# Patient Record
Sex: Male | Born: 1985 | Race: White | Hispanic: No | Marital: Married | Smoking: Former smoker
Health system: Southern US, Community
[De-identification: ages and names within clinical notes are randomized; demographics above are authoritative.]

## PROBLEM LIST (undated history)

## (undated) ENCOUNTER — Ambulatory Visit: Disposition: A | Discharge status: Home / Self Care | Payer: No Typology Code available for payment source

## (undated) DIAGNOSIS — E291 Testicular hypofunction: Secondary | ICD-10-CM

## (undated) DIAGNOSIS — F419 Anxiety disorder, unspecified: Secondary | ICD-10-CM

## (undated) DIAGNOSIS — E785 Hyperlipidemia, unspecified: Secondary | ICD-10-CM

## (undated) DIAGNOSIS — M25519 Pain in unspecified shoulder: Secondary | ICD-10-CM

## (undated) HISTORY — DX: Testicular hypofunction: E29.1

## (undated) HISTORY — DX: Hyperlipidemia, unspecified: E78.5

---

## 2007-02-23 HISTORY — PX: WISDOM TOOTH EXTRACTION: SHX21

## 2011-04-15 ENCOUNTER — Ambulatory Visit: Payer: Self-pay

## 2011-06-19 ENCOUNTER — Ambulatory Visit: Payer: Self-pay | Admitting: Internal Medicine

## 2014-05-12 ENCOUNTER — Ambulatory Visit: Payer: Self-pay | Admitting: Emergency Medicine

## 2015-08-12 ENCOUNTER — Other Ambulatory Visit: Payer: Self-pay | Admitting: Orthopedic Surgery

## 2015-08-12 DIAGNOSIS — M25512 Pain in left shoulder: Secondary | ICD-10-CM

## 2015-08-12 DIAGNOSIS — M75102 Unspecified rotator cuff tear or rupture of left shoulder, not specified as traumatic: Secondary | ICD-10-CM

## 2015-08-20 ENCOUNTER — Other Ambulatory Visit: Payer: Self-pay

## 2015-09-08 ENCOUNTER — Other Ambulatory Visit: Payer: Self-pay | Admitting: Orthopedic Surgery

## 2015-09-08 DIAGNOSIS — M75102 Unspecified rotator cuff tear or rupture of left shoulder, not specified as traumatic: Secondary | ICD-10-CM

## 2015-09-08 DIAGNOSIS — M25512 Pain in left shoulder: Secondary | ICD-10-CM

## 2015-09-24 ENCOUNTER — Ambulatory Visit
Admission: RE | Admit: 2015-09-24 | Discharge: 2015-09-24 | Disposition: A | Discharge status: Home / Self Care | Payer: No Typology Code available for payment source | Source: Ambulatory Visit | Attending: Orthopedic Surgery | Admitting: Orthopedic Surgery

## 2015-09-24 DIAGNOSIS — M25512 Pain in left shoulder: Secondary | ICD-10-CM | POA: Diagnosis not present

## 2015-09-24 DIAGNOSIS — M75102 Unspecified rotator cuff tear or rupture of left shoulder, not specified as traumatic: Secondary | ICD-10-CM | POA: Diagnosis not present

## 2015-09-24 MED ORDER — IOHEXOL 180 MG/ML  SOLN
20.0000 mL | Freq: Once | INTRAMUSCULAR | Status: AC | PRN
  Administered 2015-09-24: 10 mL via INTRA_ARTICULAR

## 2015-09-24 MED ORDER — SODIUM CHLORIDE 0.9 % IJ SOLN
10.0000 mL | INTRAMUSCULAR | Status: DC | PRN
  Administered 2015-09-24: 10 mL
  Filled 2015-09-24: qty 10

## 2015-09-24 MED ORDER — GADOBENATE DIMEGLUMINE 529 MG/ML IV SOLN
5.0000 mL | Freq: Once | INTRAVENOUS | Status: AC | PRN
  Administered 2015-09-24: 1 mL via INTRA_ARTICULAR

## 2016-01-03 ENCOUNTER — Ambulatory Visit (INDEPENDENT_AMBULATORY_CARE_PROVIDER_SITE_OTHER): Payer: No Typology Code available for payment source

## 2016-01-03 ENCOUNTER — Encounter: Payer: Self-pay | Admitting: Gynecology

## 2016-01-03 ENCOUNTER — Ambulatory Visit
Admission: EM | Admit: 2016-01-03 | Discharge: 2016-01-03 | Disposition: A | Discharge status: Home / Self Care | Payer: No Typology Code available for payment source | Attending: Emergency Medicine | Admitting: Emergency Medicine

## 2016-01-03 DIAGNOSIS — M545 Low back pain, unspecified: Secondary | ICD-10-CM

## 2016-01-03 HISTORY — DX: Pain in unspecified shoulder: M25.519

## 2016-01-03 HISTORY — DX: Anxiety disorder, unspecified: F41.9

## 2016-01-03 MED ORDER — PREDNISONE 10 MG (21) PO TBPK: ORAL_TABLET | ORAL | 0 refills | Status: DC

## 2016-01-03 MED ORDER — TIZANIDINE HCL 4 MG PO TABS: 4.0000 mg | ORAL_TABLET | Freq: Three times a day (TID) | ORAL | 0 refills | Status: DC | PRN

## 2016-01-03 MED ORDER — HYDROCODONE-ACETAMINOPHEN 5-325 MG PO TABS: 1.0000 | ORAL_TABLET | Freq: Four times a day (QID) | ORAL | 0 refills | Status: DC | PRN

## 2016-01-03 NOTE — ED Triage Notes (Signed)
Patient c/o lower right back pain x over three days.

## 2016-01-03 NOTE — ED Provider Notes (Signed)
HPI  SUBJECTIVE:  Johnathan King is a 30 y.o. male who presents with 3-4 days of right, sharp, intermittent, minutes long low back pain. He states that the pain is present depending on his activity.  it is nonmigratory. He states it radiated down the anterior central part of his leg at the first day, but this has now resolved. He denies any trauma, recent heavy lifting or change in his physical routine. He works out frequently. States that he has not worked out his back recently. He tried 500 mg of Naprosyn one hour PTA, which did not help. Symptoms are better with sitting still, worse with walking, going from sitting to standing and bending forward. denies N/V, fevers, flank pain, abdominal pain, urinary urgency, frequency, dysuria, cloudy or odorous urine, hematuria.  No syncope. No saddle anesthesia, distal weakness/numbness, bilateral radicular leg pain/weakness, fevers/night sweats, recent h/o trauma,  bladder/ bowel incontinence, urinary retention, h/o CA / multiple myleoma, unexplained weight loss, pain worse at night,  h/o prolonged steroid use, h/o osteopenia, h/o IVDU, h/o HIV, known AAA.  States feels similar to but is worse compared to previous episodes of back pain. no h/o pyelonephritis, nephrolithiasis. [facility]  Past Medical History:  Diagnosis Date  . Anxiety   . Shoulder pain     History reviewed. No pertinent surgical history.  No family history on file.  Social History  Substance Use Topics  . Smoking status: Never Smoker  . Smokeless tobacco: Never Used  . Alcohol use Yes    No current facility-administered medications for this encounter.   Current Outpatient Prescriptions:  .  escitalopram (LEXAPRO) 10 MG tablet, Take 10 mg by mouth daily., Disp: , Rfl:  .  Multiple Vitamin (MULTIVITAMIN) tablet, Take 1 tablet by mouth daily., Disp: , Rfl:  .  naproxen (NAPROSYN) 500 MG tablet, Take 500 mg by mouth 2 (two) times daily with a meal., Disp: ,  Rfl:  .  TESTOSTERONE IM, Inject into the muscle., Disp: , Rfl:  .  HYDROcodone-acetaminophen (NORCO/VICODIN) 5-325 MG tablet, Take 1-2 tablets by mouth every 6 (six) hours as needed for moderate pain., Disp: 24 tablet, Rfl: 0 .  predniSONE (STERAPRED UNI-PAK 21 TAB) 10 MG (21) TBPK tablet, Dispense one 6 day pack. Take as directed with food., Disp: 21 tablet, Rfl: 0 .  tiZANidine (ZANAFLEX) 4 MG tablet, Take 1 tablet (4 mg total) by mouth every 8 (eight) hours as needed for muscle spasms., Disp: 30 tablet, Rfl: 0  Not on File   ROS  As noted in HPI.   Physical Exam  BP 135/85 (BP Location: Left Arm)   Pulse 76   Temp 98.8 F (37.1 C) (Oral)   Resp 18   Ht 5\' 9"  (1.753 m)   Wt 205 lb (93 kg)   SpO2 99%   BMI 30.27 kg/m   Constitutional: Well developed, well nourished, no acute distress Eyes:  EOMI, conjunctiva normal bilaterally HENT: Normocephalic, atraumatic,mucus membranes moist Respiratory: Normal inspiratory effort Cardiovascular: Normal rate GI: nondistended. No suprapubic tenderness skin: No rash, skin intact Musculoskeletal: no CVAT. + R paralumbar tenderness, + muscle spasm. No bony tenderness. Bilateral lower extremities nontender. Pain aggravated with active knee extension, right hip extension, passive hip flexion bilaterally. No pain with int/ext rotation extension hips bilaterally. SLR neg bilaterally. Sensation baseline light touch bilaterally for Pt, DTR's symmetric and intact bilaterally KJ, Motor symmetric bilateral 5/5 hip flexion, quadriceps, hamstrings, EHL, foot dorsiflexion, foot plantarflexion, gait normal.  Neurologic:  Alert & oriented x 3, no focal neuro deficits Psychiatric: Speech and behavior appropriate   ED Course   Medications - No data to display  Orders Placed This Encounter  Procedures  . DG Lumbar Spine Complete    Standing Status:   Standing    Number of Occurrences:   1    Order Specific Question:   Reason for Exam (SYMPTOM  OR  DIAGNOSIS REQUIRED)    Answer:   R LBP r/o fx, dislcoation, DJD  . Ambulatory referral to Physical Therapy    Referral Priority:   Routine    Referral Type:   Physical Medicine    Referral Reason:   Specialty Services Required    Requested Specialty:   Physical Therapy    Number of Visits Requested:   1    No results found for this or any previous visit (from the past 24 hour(s)). Dg Lumbar Spine Complete  Result Date: 01/03/2016 CLINICAL DATA:  Right acute low back pain for 1 week EXAM: LUMBAR SPINE - COMPLETE 4+ VIEW COMPARISON:  None. FINDINGS: There is no evidence of lumbar spine fracture. Alignment is normal. Intervertebral disc spaces are maintained. IMPRESSION: Negative. Electronically Signed   By: Judie PetitM.  Shick M.D.   On: 01/03/2016 12:12    ED Clinical Impression  Acute right-sided low back pain without sciatica   ED Assessment/Plan  [place] narcotic database reviewed. Pt with no opiate prescriptions in the past 6 months.  No historical evidence of uti, nephrolithiasis.  No historical red flags as noted in HPI. No physical red flags such as fever, bony tenderness, lower extremity weakness, saddle anesthesia. We'll get imaging  to rule out any acute changes..   Imaging independently reviewed.  negative for any acute changes. Normal alignment, normal intervertebral disc spaces. See radiology report for details   Pt ambulatory in the ED. Home with regular Naprosyn, patient states that he does not need a prescription for this, tramadol, muscle relaxants, steriod. Pt to f/u with PT and PMD.  Discussed imaging, medical decision-making, and plan for follow-up with the patient.  Discussed signs and symptoms that should prompt return to the emergency department.  Patient agrees with plan.   *This clinic note was created using Dragon dictation software. Therefore, there may be occasional mistakes despite careful proofreading.  ?    Domenick GongAshley Karson Chicas, MD 01/05/16 [phone]

## 2017-10-09 ENCOUNTER — Encounter: Payer: Self-pay | Admitting: Gynecology

## 2017-10-09 ENCOUNTER — Ambulatory Visit
Admission: EM | Admit: 2017-10-09 | Discharge: 2017-10-09 | Disposition: A | Discharge status: Home / Self Care | Payer: No Typology Code available for payment source | Attending: Internal Medicine | Admitting: Internal Medicine

## 2017-10-09 ENCOUNTER — Other Ambulatory Visit: Payer: Self-pay

## 2017-10-09 DIAGNOSIS — X509XXA Other and unspecified overexertion or strenuous movements or postures, initial encounter: Secondary | ICD-10-CM

## 2017-10-09 DIAGNOSIS — M7522 Bicipital tendinitis, left shoulder: Secondary | ICD-10-CM | POA: Diagnosis not present

## 2017-10-09 MED ORDER — NAPROXEN 500 MG PO TABS: 500.0000 mg | ORAL_TABLET | Freq: Two times a day (BID) | ORAL | 0 refills | Status: DC

## 2017-10-09 NOTE — ED Provider Notes (Signed)
[facility]    CSN: [id number] Arrival date & time: 10/09/17  1030     History   Chief Complaint Chief Complaint  Patient presents with  . Shoulder Pain    HPI Johnathan King is a 32 y.o. male.   HPI  32 year old male presents with left shoulder pain that has had for 5 days.  It is mostly perceived at the insertion of the deltoid and over the coracoid area.  He was bench pressing 290 pounds and he allowed the weights to slightly deeper dip in approaching closer to his chest.  At that point he felt pain over the inferior medial deltoid.  It is been increasing since that time.  Hurts mostly with overhead motion or when throwing as in a ball.  He has not returned to lifting weights since the injury.         Past Medical History:  Diagnosis Date  . Anxiety   . Shoulder pain     There are no active problems to display for this patient.   History reviewed. No pertinent surgical history.     Home Medications    Prior to Admission medications   Medication Sig Start Date End Date Taking? Authorizing Provider  escitalopram (LEXAPRO) 10 MG tablet Take 10 mg by mouth daily.   Yes [provider]  Multiple Vitamin (MULTIVITAMIN) tablet Take 1 tablet by mouth daily.   Yes [provider]  TESTOSTERONE IM Inject into the muscle.   Yes [provider]  naproxen (NAPROSYN) 500 MG tablet Take 1 tablet (500 mg total) by mouth 2 (two) times daily with a meal. 10/09/17   Lutricia Feiloemer, William P, PA-C  tiZANidine (ZANAFLEX) 4 MG tablet Take 1 tablet (4 mg total) by mouth every 8 (eight) hours as needed for muscle spasms. 01/03/16   Domenick GongMortenson, Ashley, MD    Family History Family History  Problem Relation Age of Onset  . Hypertension Father     Social History Social History   Tobacco Use  . Smoking status: Never Smoker  . Smokeless tobacco: Never Used  Substance Use Topics  . Alcohol use: Yes  . Drug use: Not on file      Allergies   Patient has no known allergies.   Review of Systems Review of Systems  Constitutional: Positive for activity change. Negative for appetite change, chills, fatigue and fever.  Musculoskeletal: Positive for myalgias.  All other systems reviewed and are negative.    Physical Exam Triage Vital Signs ED Triage Vitals  Enc Vitals Group     BP 10/09/17 1036 (!) 134/93     Pulse Rate 10/09/17 1036 91     Resp --      Temp 10/09/17 1036 98.4 F (36.9 C)     Temp Source 10/09/17 1036 Oral     SpO2 10/09/17 1036 98 %     Weight 10/09/17 1037 210 lb (95.3 kg)     Height 10/09/17 1037 5\' 9"  (1.753 m)     Head Circumference --      Peak Flow --      Pain Score 10/09/17 1037 6     Pain Loc --      Pain Edu? --      Excl. in GC? --    No data found.  Updated Vital Signs BP (!) 134/93 (BP Location: Left Arm)   Pulse 91   Temp 98.4 F (36.9 C) (Oral)   Ht 5\' 9"  (1.753 m)  Wt 210 lb (95.3 kg)   SpO2 98%   BMI 31.01 kg/m   Visual Acuity Right Eye Distance:   Left Eye Distance:   Bilateral Distance:    Right Eye Near:   Left Eye Near:    Bilateral Near:     Physical Exam  Constitutional: He is oriented to person, place, and time. He appears well-developed and well-nourished. No distress.  HENT:  Head: Normocephalic.  Eyes: Pupils are equal, round, and reactive to light. Right eye exhibits no discharge. Left eye exhibits no discharge.  Neck: Normal range of motion.  Musculoskeletal: Normal range of motion. He exhibits tenderness.  Exam of the left shoulder shows nests of the medial aspect of the deltoid body and also maximal over the coracoid process.  Flexion does not seem to bother him but is more noticeable with internal rotation than external rotation.  He is a negative empty can test.  Is a negative arm drop test.  Neurological: He is alert and oriented to person, place, and time.  Skin: Skin is warm and dry. He is not diaphoretic.  Psychiatric: He  has a normal mood and affect. His behavior is normal. Judgment and thought content normal.  Nursing note and vitals reviewed.    UC Treatments / Results  Labs (all labs ordered are listed, but only abnormal results are displayed) Labs Reviewed - No data to display  EKG None  Radiology No results found.  Procedures Procedures (including critical care time)  Medications Ordered in UC Medications - No data to display  Initial Impression / Assessment and Plan / UC Course  I have reviewed the triage vital signs and the nursing notes.  Pertinent labs & imaging results that were available during my care of the patient were reviewed by me and considered in my medical decision making (see chart for details).     Plan: 1. Test/x-ray results and diagnosis reviewed with patient 2. rx as per orders; risks, benefits, potential side effects reviewed with patient 3. Recommend supportive treatment with rest and symptom avoidance.  Ice 20 minutes every 2 hours 4-5 times daily.  Should rest his shoulder for the next 2 to 4 weeks.  When he does return to activities he was cautioned to return slowly and increase gradually to his present abilities.  Prescribed Naprosyn 500 mg twice daily with meals.  If he is not improving he should follow-up with orthopedic surgery for possible MRI 4. F/u prn if symptoms worsen or don't improve  Final Clinical Impressions(s) / UC Diagnoses   Final diagnoses:  Tendonitis of upper biceps tendon of left shoulder   Discharge Instructions   None    ED Prescriptions    Medication Sig Dispense Auth. Provider   naproxen (NAPROSYN) 500 MG tablet Take 1 tablet (500 mg total) by mouth 2 (two) times daily with a meal. 60 tablet Lutricia Feiloemer, William P, PA-C     Controlled Substance Prescriptions [place] Controlled Substance Registry consulted? Not Applicable   Lutricia FeilRoemer, William P, PA-C 10/09/17 1124

## 2017-10-09 NOTE — ED Triage Notes (Signed)
Patient c/o left shoulder pain x 5 days.> per patient was lifting at the gym.

## 2018-01-26 ENCOUNTER — Emergency Department: Payer: No Typology Code available for payment source

## 2018-01-26 ENCOUNTER — Encounter: Payer: Self-pay | Admitting: Emergency Medicine

## 2018-01-26 ENCOUNTER — Emergency Department
Admission: EM | Admit: 2018-01-26 | Discharge: 2018-01-26 | Disposition: A | Discharge status: Home / Self Care | Payer: No Typology Code available for payment source | Attending: Emergency Medicine | Admitting: Emergency Medicine

## 2018-01-26 DIAGNOSIS — Y998 Other external cause status: Secondary | ICD-10-CM | POA: Insufficient documentation

## 2018-01-26 DIAGNOSIS — S4992XA Unspecified injury of left shoulder and upper arm, initial encounter: Secondary | ICD-10-CM | POA: Diagnosis present

## 2018-01-26 DIAGNOSIS — S46812A Strain of other muscles, fascia and tendons at shoulder and upper arm level, left arm, initial encounter: Secondary | ICD-10-CM | POA: Diagnosis not present

## 2018-01-26 DIAGNOSIS — Z79899 Other long term (current) drug therapy: Secondary | ICD-10-CM | POA: Diagnosis not present

## 2018-01-26 DIAGNOSIS — T1490XA Injury, unspecified, initial encounter: Secondary | ICD-10-CM

## 2018-01-26 DIAGNOSIS — T148XXA Other injury of unspecified body region, initial encounter: Secondary | ICD-10-CM

## 2018-01-26 DIAGNOSIS — Y93B3 Activity, free weights: Secondary | ICD-10-CM | POA: Diagnosis not present

## 2018-01-26 DIAGNOSIS — Y92838 Other recreation area as the place of occurrence of the external cause: Secondary | ICD-10-CM | POA: Diagnosis not present

## 2018-01-26 DIAGNOSIS — X509XXA Other and unspecified overexertion or strenuous movements or postures, initial encounter: Secondary | ICD-10-CM | POA: Insufficient documentation

## 2018-01-26 LAB — BASIC METABOLIC PANEL
Anion gap: 8 (ref 5–15)
BUN: 25 mg/dL — AB (ref 6–20)
CHLORIDE: 103 mmol/L (ref 98–111)
CO2: 27 mmol/L (ref 22–32)
Calcium: 9.2 mg/dL (ref 8.9–10.3)
Creatinine, Ser: 0.93 mg/dL (ref 0.61–1.24)
GFR calc Af Amer: >60 mL/min (ref >60)
GFR calc non Af Amer: >60 mL/min (ref >60)
GLUCOSE: 88 mg/dL (ref 70–99)
Potassium: 4 mmol/L (ref 3.5–5.1)
Sodium: 138 mmol/L (ref 135–145)

## 2018-01-26 NOTE — ED Triage Notes (Signed)
Pt reports got off work this am and went to the gym and was lifting weights and he felt a pop in his left shoulder and now can't lift it above half way.

## 2018-01-26 NOTE — ED Provider Notes (Signed)
[facility] Emergency Department Provider Note  ____________________________________________  Time seen: Approximately 1:05 PM  I have reviewed the triage vital signs and the nursing notes.   HISTORY  Chief Complaint Shoulder Injury    HPI Johnathan King is a 32 y.o. male that presents to the emergency department for evaluation of left anterior shoulder pain after lifting weights at the gym.  Patient states that when he was lifting weights, he felt a pop in his left shoulder.  He has had pain over the front of his shoulder since.  He has pain after he lifts his arm about halfway.  Pain does not radiate.  No additional injuries.  Patient is a Emergency planning/management officer.   Past Medical History:  Diagnosis Date  . Anxiety   . Shoulder pain     There are no active problems to display for this patient.   History reviewed. No pertinent surgical history.  Prior to Admission medications   Medication Sig Start Date End Date Taking? Authorizing Provider  escitalopram (LEXAPRO) 10 MG tablet Take 10 mg by mouth daily.    [provider]  Multiple Vitamin (MULTIVITAMIN) tablet Take 1 tablet by mouth daily.    [provider]  naproxen (NAPROSYN) 500 MG tablet Take 1 tablet (500 mg total) by mouth 2 (two) times daily with a meal. 10/09/17   Lutricia Feil, PA-C  TESTOSTERONE IM Inject into the muscle.    [provider]  tiZANidine (ZANAFLEX) 4 MG tablet Take 1 tablet (4 mg total) by mouth every 8 (eight) hours as needed for muscle spasms. 01/03/16   Domenick Gong, MD    Allergies Patient has no known allergies.  Family History  Problem Relation Age of Onset  . Hypertension Father     Social History Social History   Tobacco Use  . Smoking status: Never Smoker  . Smokeless tobacco: Never Used  Substance Use Topics  . Alcohol use: Yes  . Drug use: Not on file     Review of Systems  Cardiovascular: No chest  pain. Respiratory: No cough. No SOB. Gastrointestinal: No abdominal pain.  No nausea, no vomiting.  Musculoskeletal: Positive for shoulder pain.  Skin: Negative for rash, abrasions, lacerations, ecchymosis. Neurological: Negative for numbness or tingling   ____________________________________________   PHYSICAL EXAM:  VITAL SIGNS: ED Triage Vitals  Enc Vitals Group     BP 01/26/18 1140 (!) 141/92     Pulse Rate 01/26/18 1140 87     Resp --      Temp 01/26/18 1140 97.9 F (36.6 C)     Temp Source 01/26/18 1140 Oral     SpO2 01/26/18 1140 99 %     Weight 01/26/18 1131 205 lb (93 kg)     Height 01/26/18 1131 5\' 9"  (1.753 m)     Head Circumference --      Peak Flow --      Pain Score 01/26/18 1131 8     Pain Loc --      Pain Edu? --      Excl. in GC? --      Constitutional: Alert and oriented. Well appearing and in no acute distress. Eyes: Conjunctivae are normal. PERRL. EOMI. Head: Atraumatic. ENT:      Ears:      Nose: No congestion/rhinnorhea.      Mouth/Throat: Mucous membranes are moist.  Neck: No stridor.  No cervical spine tenderness to palpation. Cardiovascular: Normal rate, regular rhythm.  Good peripheral  circulation.  Symmetric radial pulses bilaterally. Respiratory: Normal respiratory effort without tachypnea or retractions. Lungs CTAB. Good air entry to the bases with no decreased or absent breath sounds. Gastrointestinal: Bowel sounds 4 quadrants. Soft and nontender to palpation. No guarding or rigidity. No palpable masses. No distention. Musculoskeletal: Full range of motion to all extremities. No gross deformities appreciated.  Full range of motion of left shoulder.  Tenderness to palpation over anterior shoulder.  Pain with 90 degree abduction of left shoulder.  No difficulty with resisted flexion and extension of elbow. Neurologic:  Normal speech and language. No gross focal neurologic deficits are appreciated.  Skin:  Skin is warm, dry and intact. No  rash noted. Psychiatric: Mood and affect are normal. Speech and behavior are normal. Patient exhibits appropriate insight and judgement.   ____________________________________________   LABS (all labs ordered are listed, but only abnormal results are displayed)  Labs Reviewed  BASIC METABOLIC PANEL - Abnormal; Notable for the following components:      Result Value   BUN 25 (*)    All other components within normal limits   ____________________________________________  EKG   ____________________________________________  RADIOLOGY Lexine BatonI, Kelwin Gibler, personally viewed and evaluated these images (plain radiographs) as part of my medical decision making, as well as reviewing the written report by the radiologist.  Mr Shoulder Left Wo Contrast  Result Date: 01/26/2018 CLINICAL DATA:  The patient felt a pop in the left shoulder lifting weights today with onset of pain and limited range of motion. EXAM: MRI OF THE LEFT SHOULDER WITHOUT CONTRAST TECHNIQUE: Multiplanar, multisequence MR imaging of the shoulder was performed. No intravenous contrast was administered. COMPARISON:  Plain films left shoulder 01/26/2018. MR arthrogram left shoulder 09/24/2015. FINDINGS: Rotator cuff:  Intact and normal in appearance. Muscles: There is partial imaging of edema in the anterior, medial aspect of the deltoid consistent with strain and likely tear at the musculotendinous junction. This finding is incompletely imaged. Biceps long head:  Intact and normal in appearance. Acromioclavicular Joint: Normal. Type 2 acromion. No subacromial/subdeltoid bursal fluid. Glenohumeral Joint: Negative. Labrum:  Intact. Bones:  Normal marrow signal throughout. Other: Negative. IMPRESSION: The examination is positive for strain of the anterior, medial aspect of the deltoid at the musculotendinous junction. There is likely a tear at the musculotendinous junction. The finding is incompletely imaged. Intact rotator cuff, long head  of biceps and glenoid labrum. Electronically Signed   By: Drusilla Kannerhomas  Dalessio M.D.   On: 01/26/2018 14:49   Dg Shoulder Left  Result Date: 01/26/2018 CLINICAL DATA:  32 year old male with left shoulder pain and decreased range of motion after felt a pop while lifting weights. EXAM: LEFT SHOULDER - 2+ VIEW COMPARISON:  Left shoulder arthrogram A2 17. FINDINGS: Bone mineralization is within normal limits. There is no evidence of fracture or dislocation. There is no evidence of arthropathy or other focal bone abnormality. Negative visible left ribs and lung parenchyma. IMPRESSION: Negative. Electronically Signed   By: Odessa FlemingH  Hall M.D.   On: 01/26/2018 12:27    ____________________________________________    PROCEDURES  Procedure(s) performed:    Procedures    Medications - No data to display   ____________________________________________   INITIAL IMPRESSION / ASSESSMENT AND PLAN / ED COURSE  Pertinent labs & imaging results that were available during my care of the patient were reviewed by me and considered in my medical decision making (see chart for details).  Review of the [place] CSRS was performed in accordance of the [facility] prior  to dispensing any controlled drugs.   Patient presented to the emergency department for evaluation of shoulder injury.  Exam is consistent with muscle strain or tear.  No bony abnormality on x-ray.  Patient states that he would feel more comfortable with an MRI and acknowledges that this can be done outpatient but would like to stay here and have it completed.  MRI consistent with deltoid strain and likely tear.  Patient declines IM Toradol.   Patient is to follow up with orthopedics as directed. Patient is given ED precautions to return to the ED for any worsening or new symptoms.     ____________________________________________  FINAL CLINICAL IMPRESSION(S) / ED DIAGNOSES  Final diagnoses:  Strain of left deltoid muscle, initial encounter  Muscle tear       NEW MEDICATIONS STARTED DURING THIS VISIT:  ED Discharge Orders    None          This chart was dictated using voice recognition software/Dragon. Despite best efforts to proofread, errors can occur which can change the meaning. Any change was purely unintentional.    Enid Derry, PA-C 01/26/18 1545    Governor Rooks, MD 01/26/18 [phone]

## 2018-01-26 NOTE — ED Notes (Addendum)
Pt states aching pain at L shoulder a 1/10 when relaxed and an 8/10 when he moves the arm anteriorly or laterally.

## 2018-01-26 NOTE — ED Notes (Signed)
Taken to MRI by staff 

## 2018-01-27 DIAGNOSIS — S46019A Strain of muscle(s) and tendon(s) of the rotator cuff of unspecified shoulder, initial encounter: Secondary | ICD-10-CM | POA: Insufficient documentation

## 2018-03-12 ENCOUNTER — Encounter: Payer: Self-pay | Admitting: Emergency Medicine

## 2018-03-12 ENCOUNTER — Other Ambulatory Visit: Payer: Self-pay

## 2018-03-12 ENCOUNTER — Ambulatory Visit
Admission: EM | Admit: 2018-03-12 | Discharge: 2018-03-12 | Disposition: A | Discharge status: Home / Self Care | Payer: PRIVATE HEALTH INSURANCE | Attending: Family Medicine | Admitting: Family Medicine

## 2018-03-12 DIAGNOSIS — R0989 Other specified symptoms and signs involving the circulatory and respiratory systems: Secondary | ICD-10-CM | POA: Diagnosis not present

## 2018-03-12 DIAGNOSIS — R05 Cough: Secondary | ICD-10-CM

## 2018-03-12 DIAGNOSIS — R509 Fever, unspecified: Secondary | ICD-10-CM | POA: Diagnosis not present

## 2018-03-12 DIAGNOSIS — R69 Illness, unspecified: Secondary | ICD-10-CM

## 2018-03-12 DIAGNOSIS — B9789 Other viral agents as the cause of diseases classified elsewhere: Secondary | ICD-10-CM

## 2018-03-12 DIAGNOSIS — J111 Influenza due to unidentified influenza virus with other respiratory manifestations: Secondary | ICD-10-CM

## 2018-03-12 LAB — RAPID STREP SCREEN (MED CTR MEBANE ONLY): Streptococcus, Group A Screen (Direct): NEGATIVE

## 2018-03-12 MED ORDER — HYDROCOD POLST-CPM POLST ER 10-8 MG/5ML PO SUER: 5.0000 mL | Freq: Every evening | ORAL | 0 refills | Status: DC | PRN

## 2018-03-12 MED ORDER — BALOXAVIR MARBOXIL(80 MG DOSE) 2 X 40 MG PO TBPK: 2.0000 | ORAL_TABLET | Freq: Once | ORAL | 0 refills | Status: AC

## 2018-03-12 NOTE — Discharge Instructions (Signed)
Take medication as prescribed. Rest. Drink plenty of fluids. Tylenol and ibuprofen.  ° °Follow up with your primary care physician this week as needed. Return to Urgent care for new or worsening concerns.  ° °

## 2018-03-12 NOTE — ED Provider Notes (Signed)
[facility] ____________________________________________  Time seen: Approximately 2:17 PM  I have reviewed the triage vital signs and the nursing notes.   HISTORY  Chief Complaint Cough; Generalized Body Aches; and Fever   HPI Johnathan King is a 33 y.o. male presenting for evaluation of cough, congestion, sore throat and fever since Friday.  Reports fever started Friday night, other symptoms started during the day on Friday.  States T-max 102.  Has been alternating Tylenol and ibuprofen which helps some.  States sore throat was severe last night, milder now but persist.  States feels generally sore as well as sore from coughing.  States wife treated for influenza this past week prior to sickness onset.  Denies other known direct sick contacts.  Continues to drink fluids well.  Denies chest pain or shortness of breath, abdominal pain, diarrhea or vomiting.  Denies recent sickness.  Reports otherwise doing well.   Past Medical History:  Diagnosis Date  . Anxiety   . Shoulder pain     There are no active problems to display for this patient.   History reviewed. No pertinent surgical history.   No current facility-administered medications for this encounter.   Current Outpatient Medications:  .  escitalopram (LEXAPRO) 10 MG tablet, Take 10 mg by mouth daily., Disp: , Rfl:  .  Multiple Vitamin (MULTIVITAMIN) tablet, Take 1 tablet by mouth daily., Disp: , Rfl:  .  TESTOSTERONE IM, Inject into the muscle., Disp: , Rfl:  .  Baloxavir Marboxil,80 MG Dose, (XOFLUZA) 2 x 40 MG TBPK, Take 2 tablets by mouth once for 1 dose., Disp: 2 each, Rfl: 0 .  chlorpheniramine-HYDROcodone (TUSSIONEX PENNKINETIC ER) 10-8 MG/5ML SUER, Take 5 mLs by mouth at bedtime as needed for cough. do not drive or operate machinery while taking as can cause drowsiness., Disp: 50 mL, Rfl: 0  Allergies Patient has no known allergies.  Family History  Problem Relation Age of Onset  .  Hypertension Father     Social History Social History   Tobacco Use  . Smoking status: Never Smoker  . Smokeless tobacco: Never Used  Substance Use Topics  . Alcohol use: Yes  . Drug use: Not on file    Review of Systems Constitutional: Positive fever ENT: As above.  Cardiovascular: Denies chest pain. Respiratory: Denies shortness of breath. Gastrointestinal: No abdominal pain.  No nausea, no vomiting.  No diarrhea.  Genitourinary: Negative for dysuria.  Acute Skin: Negative for rash.  ____________________________________________   PHYSICAL EXAM:  VITAL SIGNS: ED Triage Vitals  Enc Vitals Group     BP 03/12/18 1321 (!) 142/92     Pulse Rate 03/12/18 1321 (!) 118     Resp 03/12/18 1321 16     Temp 03/12/18 1321 99.2 F (37.3 C)     Temp Source 03/12/18 1321 Oral     SpO2 03/12/18 1321 98 %     Weight 03/12/18 1319 205 lb (93 kg)     Height 03/12/18 1319 5\' 9"  (1.753 m)     Head Circumference --      Peak Flow --      Pain Score 03/12/18 1319 7     Pain Loc --      Pain Edu? --      Excl. in [place]? --    Constitutional: Alert and oriented. Well appearing and in no acute distress. Eyes: Conjunctivae are normal.  Head: Atraumatic. No sinus tenderness to palpation. No swelling. No erythema.  Ears: no erythema,  normal TMs bilaterally.   Nose:Nasal congestion   Mouth/Throat: Mucous membranes are moist. Mild pharyngeal erythema. No tonsillar swelling or exudate.  Neck: No stridor.  No cervical spine tenderness to palpation. Hematological/Lymphatic/Immunilogical: No cervical lymphadenopathy. Cardiovascular: Tachycardia .grossly normal heart sounds.  Good peripheral circulation. Respiratory: Normal respiratory effort.  No retractions. No wheezes, rales or rhonchi. Good air movement.  Musculoskeletal: Ambulatory with steady gait.  Neurologic:  Normal speech and language. No gait instability. Skin:  Skin appears warm, dry and intact. No rash noted. Psychiatric: Mood and  affect are normal. Speech and behavior are normal. ___________________________________________   LABS (all labs ordered are listed, but only abnormal results are displayed)  Labs Reviewed  RAPID STREP SCREEN ([facility] ONLY)  CULTURE, GROUP A STREP [facility])   ____________________________________________  PROCEDURES Procedures   INITIAL IMPRESSION / ASSESSMENT AND PLAN / ED COURSE  Pertinent labs & imaging results that were available during my care of the patient were reviewed by me and considered in my medical decision making (see chart for details).  Overall well-appearing patient.  No acute distress.  Strep negative, will culture.  Suspect influenza.  Discussed treatment options with patient.  Will treat with PRN Tussionex as needed, over-the-counter Tylenol, ibuprofen, daytime cough medication.  As well as Xofluza.  Encourage rest, fluids, supportive care.Discussed indication, risks and benefits of medications with patient.  Discussed follow up with Primary care physician this week as needed. Discussed follow up and return parameters including no resolution or any worsening concerns. Patient verbalized understanding and agreed to plan.   ____________________________________________   FINAL CLINICAL IMPRESSION(S) / ED DIAGNOSES  Final diagnoses:  Influenza-like illness     ED Discharge Orders         Ordered    Baloxavir Marboxil,80 MG Dose, (XOFLUZA) 2 x 40 MG TBPK   Once     03/12/18 1407    chlorpheniramine-HYDROcodone (TUSSIONEX PENNKINETIC ER) 10-8 MG/5ML SUER  At bedtime PRN     03/12/18 1407           Note: This dictation was prepared with Dragon dictation along with smaller phrase technology. Any transcriptional errors that result from this process are unintentional.         Renford DillsMiller, Bathsheba Durrett, NP 03/12/18 1420

## 2018-03-12 NOTE — ED Triage Notes (Signed)
Patient c/o cough, congestion, bodyaches and fever that started on Friday.

## 2018-03-15 LAB — CULTURE, GROUP A STREP (THRC)

## 2018-03-16 ENCOUNTER — Telehealth: Payer: Self-pay | Admitting: Family Medicine

## 2018-03-16 MED ORDER — AMOXICILLIN 500 MG PO TABS: 500.0000 mg | ORAL_TABLET | Freq: Two times a day (BID) | ORAL | 0 refills | Status: DC

## 2018-03-16 NOTE — Telephone Encounter (Signed)
Strep culture positive for non group A. Patient still symptomatic. Sending in Amox.  Everlene Other DO [facility]

## 2018-12-01 ENCOUNTER — Other Ambulatory Visit: Payer: Self-pay

## 2018-12-01 DIAGNOSIS — Z20822 Contact with and (suspected) exposure to covid-19: Secondary | ICD-10-CM

## 2018-12-02 LAB — NOVEL CORONAVIRUS, NAA: SARS-CoV-2, NAA: NOT DETECTED

## 2018-12-05 ENCOUNTER — Other Ambulatory Visit: Payer: Self-pay

## 2018-12-05 DIAGNOSIS — Z20822 Contact with and (suspected) exposure to covid-19: Secondary | ICD-10-CM

## 2018-12-07 LAB — NOVEL CORONAVIRUS, NAA: SARS-CoV-2, NAA: NOT DETECTED

## 2019-01-09 ENCOUNTER — Other Ambulatory Visit: Payer: Self-pay

## 2019-01-09 DIAGNOSIS — Z20822 Contact with and (suspected) exposure to covid-19: Secondary | ICD-10-CM

## 2019-01-11 LAB — NOVEL CORONAVIRUS, NAA: SARS-CoV-2, NAA: DETECTED — AB

## 2019-01-27 ENCOUNTER — Encounter: Payer: Self-pay | Admitting: Emergency Medicine

## 2019-01-27 ENCOUNTER — Other Ambulatory Visit: Payer: Self-pay

## 2019-01-27 ENCOUNTER — Ambulatory Visit
Admission: EM | Admit: 2019-01-27 | Discharge: 2019-01-27 | Disposition: A | Discharge status: Home / Self Care | Payer: PRIVATE HEALTH INSURANCE | Attending: Emergency Medicine | Admitting: Emergency Medicine

## 2019-01-27 DIAGNOSIS — J029 Acute pharyngitis, unspecified: Secondary | ICD-10-CM

## 2019-01-27 LAB — RAPID STREP SCREEN (MED CTR MEBANE ONLY): Streptococcus, Group A Screen (Direct): NEGATIVE

## 2019-01-27 MED ORDER — AMOXICILLIN 875 MG PO TABS: 875.0000 mg | ORAL_TABLET | Freq: Two times a day (BID) | ORAL | 0 refills | Status: DC

## 2019-01-27 NOTE — ED Provider Notes (Signed)
[facility] ____________________________________________  Time seen: Approximately 10:27 AM  I have reviewed the triage vital signs and the nursing notes.   HISTORY  Chief Complaint Sore Throat  HPI Johnathan King is a 33 y.o. male presenting for evaluation of sore throat present since yesterday.  Reports last night sore throat continued to worsen to where it felt like swallowing razor blades.  States this feels consistent with previous strep throat infections.  Denies cough, congestion, chest pain, shortness of breath or fevers.  Denies known direct sick contacts.  Patient did have COVID-19, diagnosis 01/09/2019 reports has been doing well.  Has overall continued to eat and drink well but painful swallowing.  Denies other complaints.   Past Medical History:  Diagnosis Date  . Anxiety   . Shoulder pain     There are no active problems to display for this patient.   History reviewed. No pertinent surgical history.   No current facility-administered medications for this encounter.   Current Outpatient Medications:  .  escitalopram (LEXAPRO) 10 MG tablet, Take 10 mg by mouth daily., Disp: , Rfl:  .  Multiple Vitamin (MULTIVITAMIN) tablet, Take 1 tablet by mouth daily., Disp: , Rfl:  .  TESTOSTERONE IM, Inject into the muscle., Disp: , Rfl:  .  amoxicillin (AMOXIL) 875 MG tablet, Take 1 tablet (875 mg total) by mouth 2 (two) times daily., Disp: 20 tablet, Rfl: 0  Allergies Patient has no known allergies.  Family History  Problem Relation Age of Onset  . Hypertension Father     Social History Social History   Tobacco Use  . Smoking status: Never Smoker  . Smokeless tobacco: Never Used  Substance Use Topics  . Alcohol use: Yes  . Drug use: Never    Review of Systems Constitutional: No fever. ENT: Positive sore throat. Cardiovascular: Denies chest pain. Respiratory: Denies shortness of breath. Skin: Negative for rash. Neurological: Negative  for headaches.  ____________________________________________   PHYSICAL EXAM:  VITAL SIGNS: ED Triage Vitals  Enc Vitals Group     BP 01/27/19 0956 (!) 142/96     Pulse Rate 01/27/19 0956 72     Resp 01/27/19 0956 16     Temp 01/27/19 0956 98.4 F (36.9 C)     Temp Source 01/27/19 0956 Oral     SpO2 01/27/19 0956 100 %     Weight 01/27/19 0953 208 lb (94.3 kg)     Height 01/27/19 0953 5\' 9"  (1.753 m)     Head Circumference --      Peak Flow --      Pain Score 01/27/19 0953 5     Pain Loc --      Pain Edu? --      Excl. in [place]? --     Constitutional: Alert and oriented. Well appearing and in no acute distress. Eyes: Conjunctivae are normal. Head: Atraumatic.No swelling. No erythema.  Ears: no erythema, normal TMs bilaterally.   Nose:No nasal congestion.   Mouth/Throat: Mucous membranes are moist. Moderate pharyngeal erythema. Mild bilateral tonsillar swelling. No exudate. No uvular shift or deviation.   Neck: No stridor.  No cervical spine tenderness to palpation. Hematological/Lymphatic/Immunilogical: No cervical lymphadenopathy. Cardiovascular: Normal rate, regular rhythm. Grossly normal heart sounds.  Good peripheral circulation. Respiratory: Normal respiratory effort.  No retractions. No wheezes, rales or rhonchi. Good air movement.  Musculoskeletal: Ambulatory with steady gait.  Neurologic:  Normal speech and language.  Skin:  Skin appears warm, dry and intact. No rash noted.  Psychiatric: Mood and affect are normal. Speech and behavior are normal.  ___________________________________________   LABS (all labs ordered are listed, but only abnormal results are displayed)  Labs Reviewed  RAPID STREP SCREEN ([facility] ONLY)  CULTURE, GROUP A STREP [facility])    PROCEDURES   INITIAL IMPRESSION / ASSESSMENT AND PLAN / ED COURSE  Pertinent labs & imaging results that were available during my care of the patient were reviewed by me and considered in my medical  decision making (see chart for details).  Well-appearing patient.  No acute distress.  Pharyngitis complaints.  Strep negative, will culture.  Concern for streptococcal pharyngitis.  Will empirically treat with oral amoxicillin and await strep culture.  Encourage rest, fluids, supportive care. Discussed indication, risks and benefits of medications with patient.    Discussed follow up and return parameters including no resolution or any worsening concerns. Patient verbalized understanding and agreed to plan.   ____________________________________________   FINAL CLINICAL IMPRESSION(S) / ED DIAGNOSES  Final diagnoses:  Pharyngitis, unspecified etiology     ED Discharge Orders         Ordered    amoxicillin (AMOXIL) 875 MG tablet  2 times daily     01/27/19 1023           Note: This dictation was prepared with Dragon dictation along with smaller phrase technology. Any transcriptional errors that result from this process are unintentional.         Renford Dills, NP 01/27/19 1103

## 2019-01-27 NOTE — Discharge Instructions (Addendum)
Take medication as prescribed. Rest. Drink plenty of fluids.  ° °Follow up with your primary care physician this week as needed. Return to Urgent care for new or worsening concerns.  ° °

## 2019-01-27 NOTE — ED Triage Notes (Signed)
Patient c/o sore throat started yesterday.  Patient denies fevers.

## 2019-01-30 LAB — CULTURE, GROUP A STREP (THRC)

## 2019-10-24 ENCOUNTER — Other Ambulatory Visit: Payer: PRIVATE HEALTH INSURANCE

## 2019-10-24 ENCOUNTER — Other Ambulatory Visit: Payer: Self-pay | Admitting: Sleep Medicine

## 2019-10-24 DIAGNOSIS — I471 Supraventricular tachycardia: Secondary | ICD-10-CM

## 2019-10-25 LAB — NOVEL CORONAVIRUS, NAA: SARS-CoV-2, NAA: NOT DETECTED

## 2020-04-24 ENCOUNTER — Other Ambulatory Visit: Payer: Self-pay

## 2020-04-24 ENCOUNTER — Ambulatory Visit
Admission: EM | Admit: 2020-04-24 | Discharge: 2020-04-24 | Disposition: A | Discharge status: Home / Self Care | Payer: PRIVATE HEALTH INSURANCE | Attending: Family Medicine | Admitting: Family Medicine

## 2020-04-24 DIAGNOSIS — E291 Testicular hypofunction: Secondary | ICD-10-CM | POA: Diagnosis not present

## 2020-04-24 DIAGNOSIS — Z760 Encounter for issue of repeat prescription: Secondary | ICD-10-CM | POA: Insufficient documentation

## 2020-04-24 LAB — COMPREHENSIVE METABOLIC PANEL
ALT: 27 U/L (ref 0–44)
AST: 29 U/L (ref 15–41)
Albumin: 4.4 g/dL (ref 3.5–5.0)
Alkaline Phosphatase: 67 U/L (ref 38–126)
Anion gap: 9 (ref 5–15)
BUN: 24 mg/dL — ABNORMAL HIGH (ref 6–20)
CO2: 27 mmol/L (ref 22–32)
Calcium: 9.1 mg/dL (ref 8.9–10.3)
Chloride: 100 mmol/L (ref 98–111)
Creatinine, Ser: 1.3 mg/dL — ABNORMAL HIGH (ref 0.61–1.24)
GFR, Estimated: >60 mL/min (ref >60)
Glucose, Bld: 83 mg/dL (ref 70–99)
Potassium: 3.9 mmol/L (ref 3.5–5.1)
Sodium: 136 mmol/L (ref 135–145)
Total Bilirubin: 0.8 mg/dL (ref 0.3–1.2)
Total Protein: 7.3 g/dL (ref 6.5–8.1)

## 2020-04-24 LAB — CBC WITH DIFFERENTIAL/PLATELET
Abs Immature Granulocytes: 0.03 10*3/uL (ref 0.00–0.07)
Basophils Absolute: 0.1 10*3/uL (ref 0.0–0.1)
Basophils Relative: 1 %
Eosinophils Absolute: 0.3 10*3/uL (ref 0.0–0.5)
Eosinophils Relative: 3 %
HCT: 51.9 % (ref 39.0–52.0)
Hemoglobin: 17.3 g/dL — ABNORMAL HIGH (ref 13.0–17.0)
Immature Granulocytes: 0 %
Lymphocytes Relative: 22 %
Lymphs Abs: 2.2 10*3/uL (ref 0.7–4.0)
MCH: 30 pg (ref 26.0–34.0)
MCHC: 33.3 g/dL (ref 30.0–36.0)
MCV: 89.9 fL (ref 80.0–100.0)
Monocytes Absolute: 0.9 10*3/uL (ref 0.1–1.0)
Monocytes Relative: 9 %
Neutro Abs: 6.4 10*3/uL (ref 1.7–7.7)
Neutrophils Relative %: 65 %
Platelets: 213 10*3/uL (ref 150–400)
RBC: 5.77 MIL/uL (ref 4.22–5.81)
RDW: 13.1 % (ref 11.5–15.5)
WBC: 9.8 10*3/uL (ref 4.0–10.5)
nRBC: 0 % (ref 0.0–0.2)

## 2020-04-24 MED ORDER — TESTOSTERONE CYPIONATE 200 MG/ML IM SOLN: 200.0000 mg | INTRAMUSCULAR | 5 refills | Status: AC

## 2020-04-24 NOTE — Discharge Instructions (Signed)
Labs on day 8 after injection.  If you need anything let me know.  Dr. Adriana Simas

## 2020-04-24 NOTE — ED Triage Notes (Signed)
Pt c/o possible low testosterone. Pt has used testosterone injections in the past.

## 2020-04-24 NOTE — ED Provider Notes (Signed)
[facility]    CSN: [id number] Arrival date & time: 04/24/20  1705      History   Chief Complaint Chief Complaint  Patient presents with  . Medication Refill   HPI  35 year old male presents for medication refill.  Patient has hypogonadism.  He is managed with IM testosterone.  He is doing well.  He denies any symptoms at this time.  His primary care provider has left the office and he does not have a primary care provider at this time.  He is in need of medication refill.  His most recent dose was on Sunday.  He has a history of elevated hemoglobin and hematocrit.  Most recent laboratory studies were done on 08/10/2019.  Hemoglobin was 18.3 and hematocrit was 55.9.  Patient requesting refill today.  He has no other complaints at this time.  Home Medications    Prior to Admission medications   Medication Sig Start Date End Date Taking? Authorizing Provider  escitalopram (LEXAPRO) 20 MG tablet Take 20 mg by mouth daily. 01/28/20  Yes [provider]  Multiple Vitamin (MULTIVITAMIN) tablet Take 1 tablet by mouth daily.   Yes [provider]  testosterone cypionate (DEPOTESTOSTERONE CYPIONATE) 200 MG/ML injection Inject 1 mL (200 mg total) into the muscle every 14 (fourteen) days. 04/24/20   Tommie Sams, DO    Family History Family History  Problem Relation Age of Onset  . Hypertension Father     Social History Social History   Tobacco Use  . Smoking status: Never Smoker  . Smokeless tobacco: Never Used  Vaping Use  . Vaping Use: Never used  Substance Use Topics  . Alcohol use: Yes  . Drug use: Never     Allergies   Patient has no known allergies.   Review of Systems Review of Systems  Constitutional: Negative.   Genitourinary: Negative.    Physical Exam Triage Vital Signs ED Triage Vitals  Enc Vitals Group     BP 04/24/20 1720 (!) 142/91     Pulse Rate 04/24/20 1720 80     Resp 04/24/20 1720 18     Temp 04/24/20 1720 98 F  (36.7 C)     Temp Source 04/24/20 1720 Oral     SpO2 04/24/20 1720 100 %     Weight 04/24/20 1718 208 lb (94.3 kg)     Height 04/24/20 1718 5\' 9"  (1.753 m)     Head Circumference --      Peak Flow --      Pain Score 04/24/20 1718 0     Pain Loc --      Pain Edu? --      Excl. in [place]? --    Updated Vital Signs BP (!) 142/91 (BP Location: Left Arm)   Pulse 80   Temp 98 F (36.7 C) (Oral)   Resp 18   Ht 5\' 9"  (1.753 m)   Wt 94.3 kg   SpO2 100%   BMI 30.72 kg/m   Visual Acuity Right Eye Distance:   Left Eye Distance:   Bilateral Distance:    Right Eye Near:   Left Eye Near:    Bilateral Near:     Physical Exam Vitals and nursing note reviewed.  Constitutional:      General: He is not in acute distress.    Appearance: Normal appearance. He is not ill-appearing.  HENT:     Head: Normocephalic and atraumatic.  Eyes:     General:  Right eye: No discharge.        Left eye: No discharge.     Conjunctiva/sclera: Conjunctivae normal.  Cardiovascular:     Rate and Rhythm: Normal rate and regular rhythm.     Heart sounds: No murmur heard.   Pulmonary:     Effort: Pulmonary effort is normal.     Breath sounds: Normal breath sounds. No wheezing, rhonchi or rales.  Neurological:     Mental Status: He is alert.  Psychiatric:        Mood and Affect: Mood normal.        Behavior: Behavior normal.    UC Treatments / Results  Labs (all labs ordered are listed, but only abnormal results are displayed) Labs Reviewed  CBC WITH DIFFERENTIAL/PLATELET - Abnormal; Notable for the following components:      Result Value   Hemoglobin 17.3 (*)    All other components within normal limits  COMPREHENSIVE METABOLIC PANEL - Abnormal; Notable for the following components:   BUN 24 (*)    Creatinine, Ser 1.30 (*)    All other components within normal limits    EKG   Radiology No results found.  Procedures Procedures (including critical care time)  Medications  Ordered in UC Medications - No data to display  Initial Impression / Assessment and Plan / UC Course  I have reviewed the triage vital signs and the nursing notes.  Pertinent labs & imaging results that were available during my care of the patient were reviewed by me and considered in my medical decision making (see chart for details).    35 year old male presents for medication refill.  Hypogonadism stable.  Testosterone refilled.  CBC done today with a mildly elevated hemoglobin of 17.3.  We will continue his current therapy.  Final Clinical Impressions(s) / UC Diagnoses   Final diagnoses:  Medication refill  Hypogonadism in male     Discharge Instructions     Labs on day 8 after injection.  If you need anything let me know.  Dr. Adriana Simas     ED Prescriptions    Medication Sig Dispense Auth. Provider   testosterone cypionate (DEPOTESTOSTERONE CYPIONATE) 200 MG/ML injection Inject 1 mL (200 mg total) into the muscle every 14 (fourteen) days. 10 mL Tommie Sams, DO     PDMP not reviewed this encounter.   Tommie Sams, [place] 04/24/20 [id number]

## 2020-04-28 ENCOUNTER — Other Ambulatory Visit: Payer: Self-pay | Admitting: Family Medicine

## 2020-04-30 LAB — COMPREHENSIVE METABOLIC PANEL
ALT: 23 IU/L (ref 0–44)
AST: 25 IU/L (ref 0–40)
Albumin/Globulin Ratio: 2.4 — ABNORMAL HIGH (ref 1.2–2.2)
Albumin: 4.5 g/dL (ref 4.0–5.0)
Alkaline Phosphatase: 80 IU/L (ref 44–121)
BUN/Creatinine Ratio: 16 (ref 9–20)
BUN: 18 mg/dL (ref 6–20)
Bilirubin Total: 0.3 mg/dL (ref 0.0–1.2)
CO2: 22 mmol/L (ref 20–29)
Calcium: 9 mg/dL (ref 8.7–10.2)
Chloride: 105 mmol/L (ref 96–106)
Creatinine, Ser: 1.13 mg/dL (ref 0.76–1.27)
Globulin, Total: 1.9 g/dL (ref 1.5–4.5)
Glucose: 90 mg/dL (ref 65–99)
Potassium: 4.5 mmol/L (ref 3.5–5.2)
Sodium: 142 mmol/L (ref 134–144)
Total Protein: 6.4 g/dL (ref 6.0–8.5)
eGFR: 88 mL/min/{1.73_m2} (ref >59)

## 2020-04-30 LAB — LIPID PANEL
Chol/HDL Ratio: 4.3 ratio (ref 0.0–5.0)
Cholesterol, Total: 132 mg/dL (ref 100–199)
HDL: 31 mg/dL — ABNORMAL LOW (ref >39)
LDL Chol Calc (NIH): 85 mg/dL (ref 0–99)
Triglycerides: 79 mg/dL (ref 0–149)
VLDL Cholesterol Cal: 16 mg/dL (ref 5–40)

## 2020-04-30 LAB — TESTOSTERONE,FREE AND TOTAL
Testosterone, Free: 38.2 pg/mL — ABNORMAL HIGH (ref 8.7–25.1)
Testosterone: 728 ng/dL (ref 264–916)

## 2020-07-22 IMAGING — MR MR SHOULDER*L* W/O CM
4 of 5 series · 31 of 40 positions shown · non-contrast
Comparison: Plain films left shoulder 01/26/2018. MR arthrogram
left shoulder 09/24/2015.

CLINICAL DATA: The patient felt a pop in the left shoulder lifting
weights today with onset of pain and limited range of motion.

EXAM:
MRI OF THE LEFT SHOULDER WITHOUT CONTRAST
TECHNIQUE: Multiplanar, multisequence MR imaging of the shoulder was performed.
No intravenous contrast was administered.

[Series 5: PD fat-sat · axial · left · 4.0mm · 0.62mm/px · z∈[-13,+117]mm · 8 of 28 slices shown (1 of 2)]
[im 1/28]
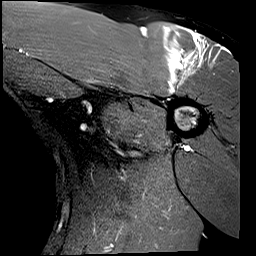
[im 4/28]
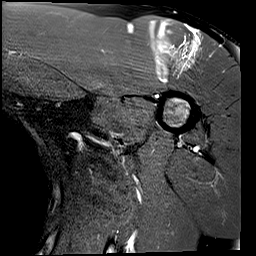
[im 8/28]
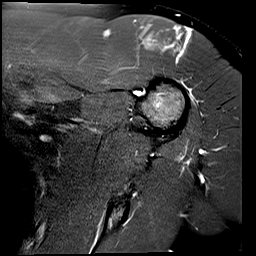
[im 12/28]
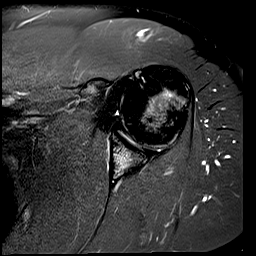
[im 16/28]
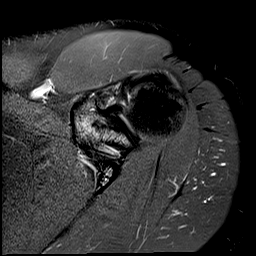
[im 20/28]
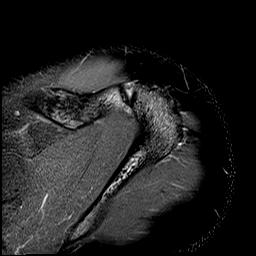
[im 24/28]
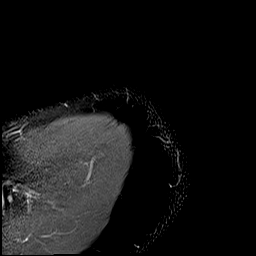
[im 28/28]
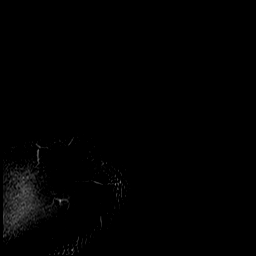

[Series 8: PD fat-sat · oblique · left · 4.0mm · 0.50mm/px · 8 of 26 slices shown (2 of 2)]
[im 1/26]
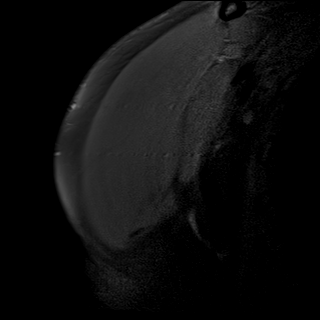
[im 4/26]
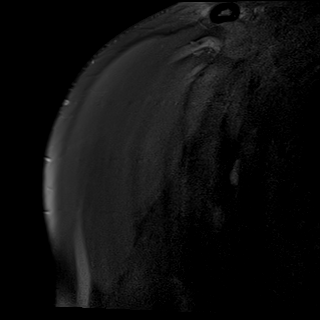
[im 8/26]
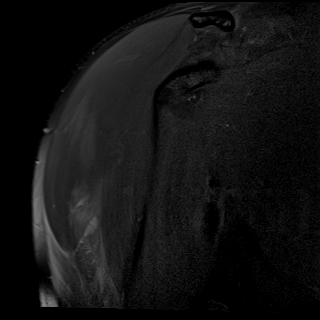
[im 11/26]
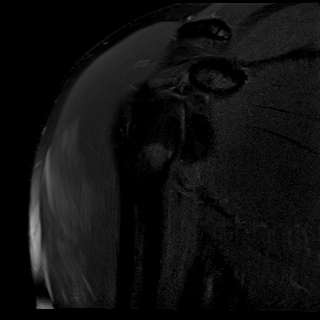
[im 15/26]
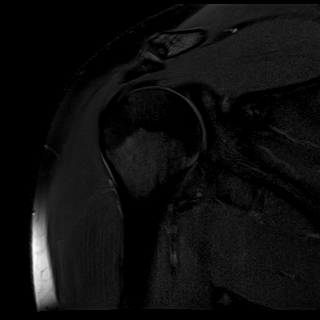
[im 18/26]
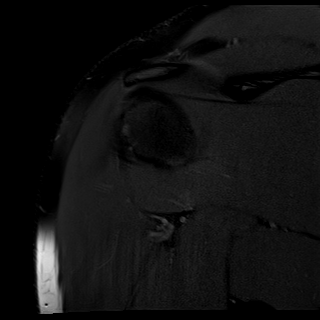
[im 22/26]
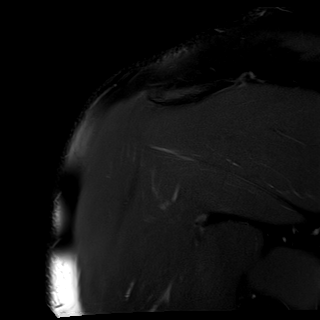
[im 26/26]
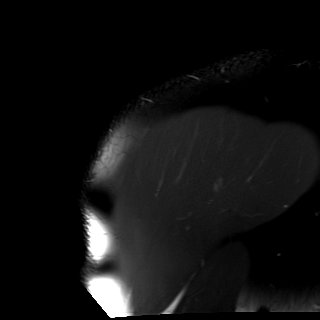

[Series 9: T2 fat-sat · oblique · left · 4.0mm · 0.50mm/px · 8 of 26 slices shown (1 of 2)]
[im 1/26]
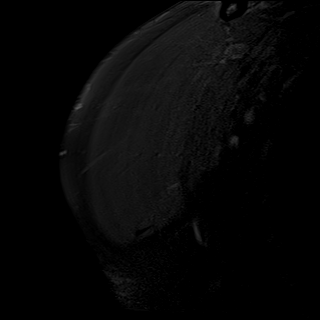
[im 4/26]
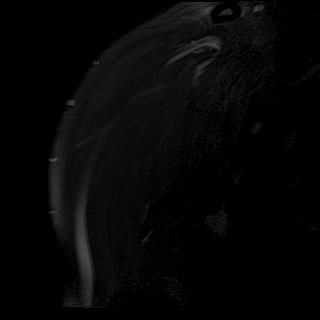
[im 8/26]
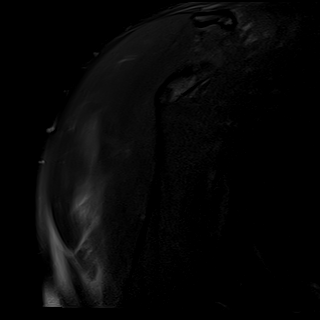
[im 11/26]
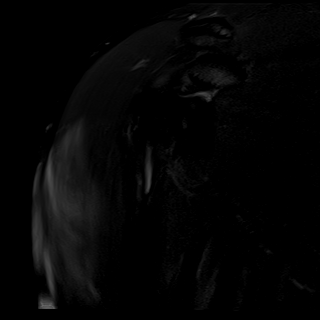
[im 15/26]
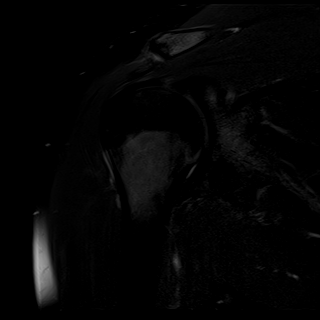
[im 18/26]
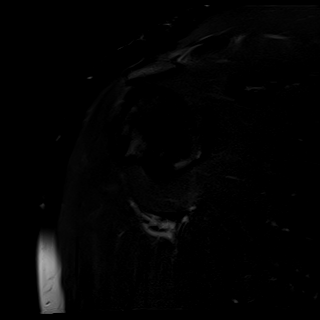
[im 22/26]
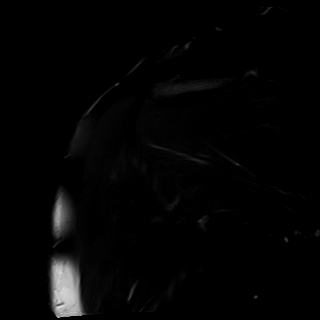
[im 26/26]
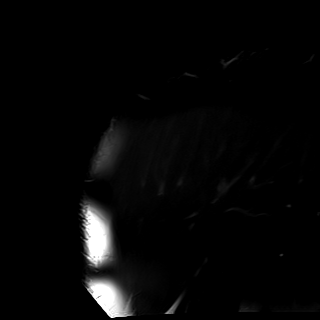

[Series 10: T2 fat-sat · coronal · left · 4.0mm · 0.26mm/px · 7 of 29 slices shown (2 of 2)]
[im 1/29]
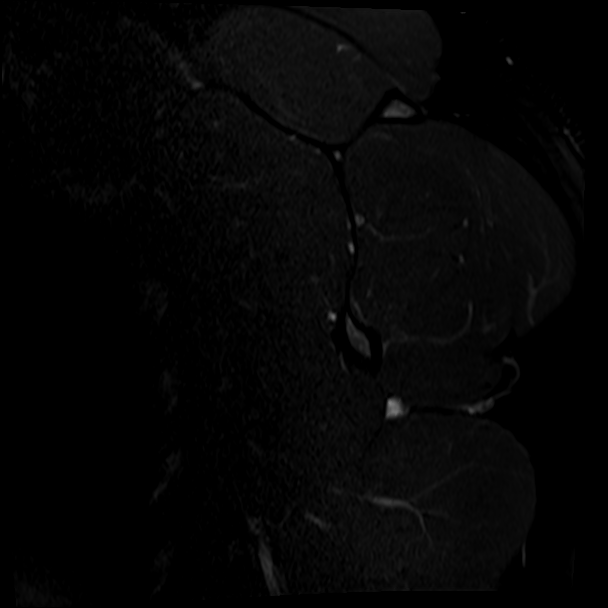
[im 5/29]
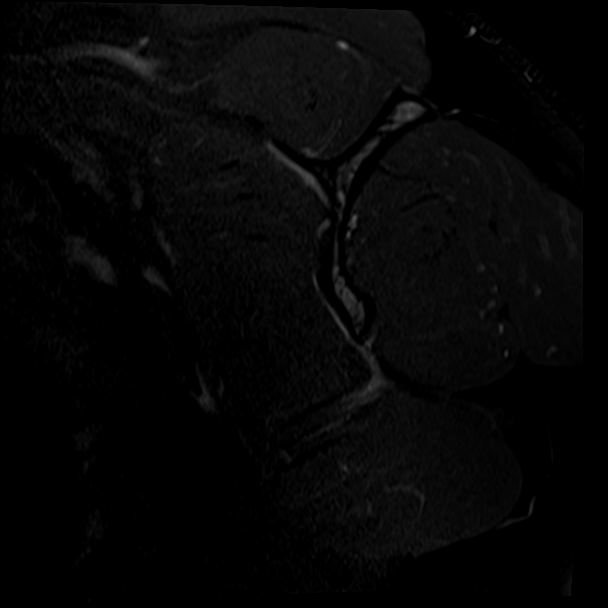
[im 9/29]
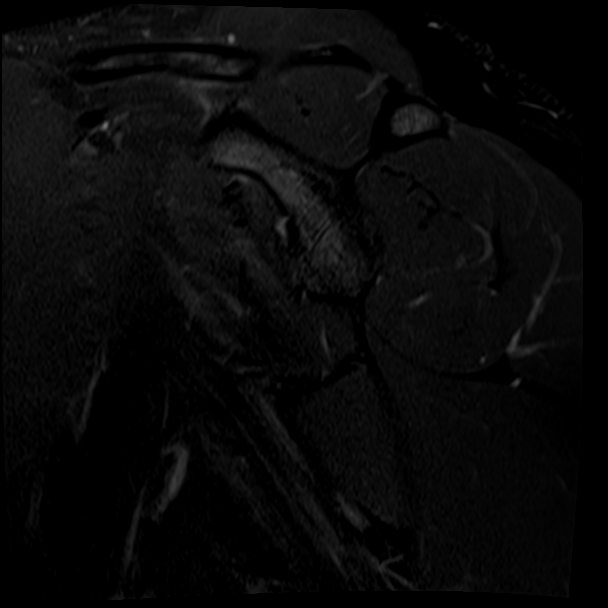
[im 13/29]
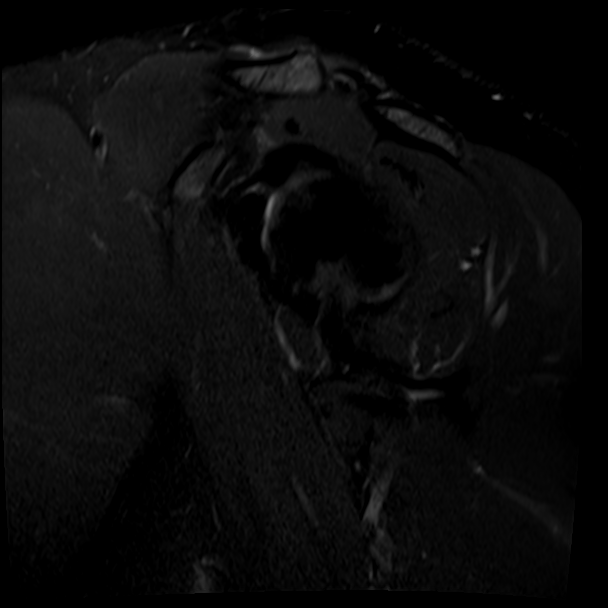
[im 17/29]
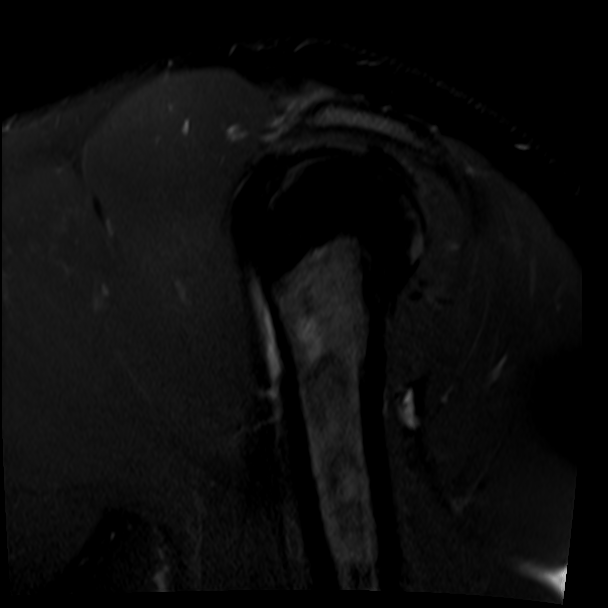
[im 21/29]
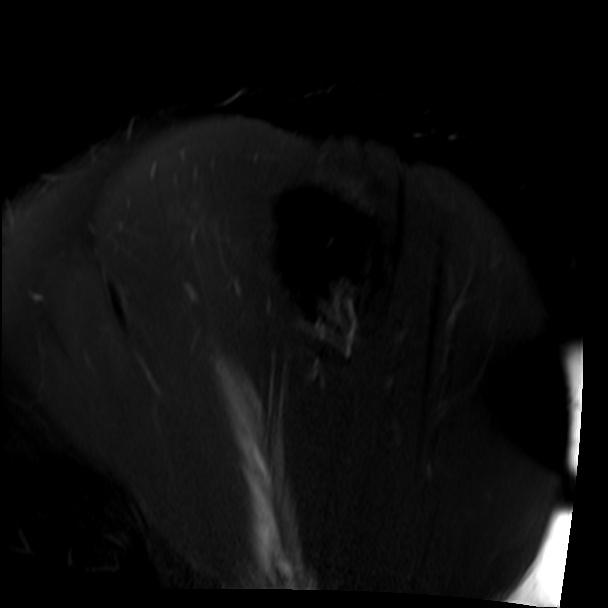
[im 25/29]
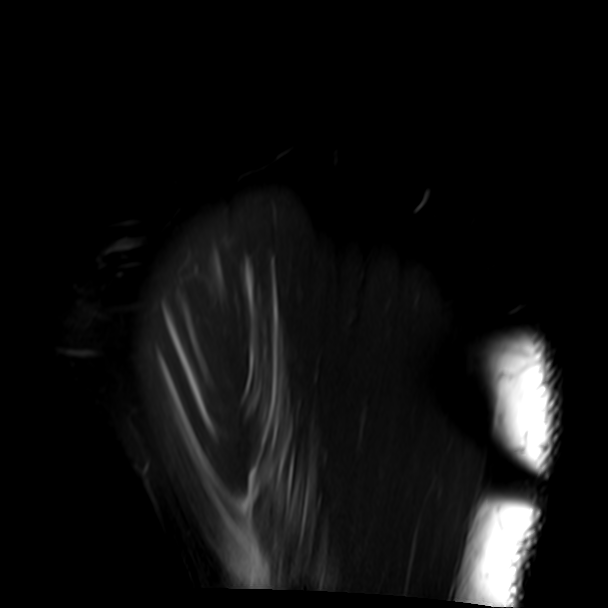

[31 of 40 positions shown; findings below may reference images not displayed]

FINDINGS: Rotator cuff:  Intact and normal in appearance.

Muscles: There is partial imaging of edema in the anterior, medial
aspect of the deltoid consistent with strain and likely tear at the
musculotendinous junction. This finding is incompletely imaged.

Biceps long head:  Intact and normal in appearance.

Acromioclavicular Joint: Normal. Type 2 acromion. No
subacromial/subdeltoid bursal fluid.

Glenohumeral Joint: Negative.

Labrum:  Intact.

Bones:  Normal marrow signal throughout.

Other: Negative.
IMPRESSION: The examination is positive for strain of the anterior, medial
aspect of the deltoid at the musculotendinous junction. There is
likely a tear at the musculotendinous junction. The finding is
incompletely imaged.

Intact rotator cuff, long head of biceps and glenoid labrum.

## 2020-08-18 ENCOUNTER — Ambulatory Visit: Payer: No Typology Code available for payment source | Admitting: Family Medicine

## 2020-08-18 ENCOUNTER — Encounter: Payer: Self-pay | Admitting: Family Medicine

## 2020-08-18 ENCOUNTER — Other Ambulatory Visit: Payer: Self-pay

## 2020-08-18 VITALS — BP 108/82 | HR 72 | Temp 97.9°F | Ht 69.0 in | Wt 212.0 lb

## 2020-08-18 DIAGNOSIS — Z23 Encounter for immunization: Secondary | ICD-10-CM

## 2020-08-18 DIAGNOSIS — R5383 Other fatigue: Secondary | ICD-10-CM | POA: Diagnosis not present

## 2020-08-18 DIAGNOSIS — E291 Testicular hypofunction: Secondary | ICD-10-CM

## 2020-08-18 NOTE — Progress Notes (Signed)
Primary Care / Sports Medicine Office Visit  Patient Information:  Patient ID: Johnathan King, male DOB: Mar 19, 1985 Age: 35 y.o. MRN: [id number]   Johnathan King is a pleasant 35 y.o. male presenting with the following:  Chief Complaint  Patient presents with   New Patient (Initial Visit)    Formerly seen at [facility]   Establish Care   Hypogonadism    Taking testosterone 200 mg every 14 days since around 35 years old   Anxiety    Taking Lexapro 20 mg daily with relief; has been on for a few years per patient    Review of Systems pertinent details above   Patient Active Problem List   Diagnosis Date Noted   Hypogonadism in male 08/18/2020   Strain of rotator cuff capsule 01/27/2018   Past Medical History:  Diagnosis Date   Anxiety    Hyperlipidemia    Hypogonadism in male    Shoulder pain    Outpatient Encounter Medications as of 08/18/2020  Medication Sig   escitalopram (LEXAPRO) 20 MG tablet Take 20 mg by mouth daily.   Multiple Vitamin (MULTIVITAMIN) tablet Take 1 tablet by mouth daily.   omega-3 acid ethyl esters (LOVAZA) 1 g capsule Take 1 g by mouth daily.   testosterone cypionate (DEPOTESTOSTERONE CYPIONATE) 200 MG/ML injection Inject 1 mL (200 mg total) into the muscle every 14 (fourteen) days.   [DISCONTINUED] testosterone cypionate (DEPOTESTOSTERONE CYPIONATE) 200 MG/ML injection Inject 200 mLs into the muscle every 14 (fourteen) days.   No facility-administered encounter medications on file as of 08/18/2020.   Past Surgical History:  Procedure Laterality Date   WISDOM TOOTH EXTRACTION Bilateral 2009    Vitals:   08/18/20 0843  BP: 108/82  Pulse: 72  Temp: 97.9 F (36.6 C)  SpO2: 99%   Vitals:   08/18/20 0843  Weight: 212 lb (96.2 kg)  Height: 5\' 9"  (1.753 m)   Body mass index is 31.31 kg/m.  No results found.   Independent interpretation of notes and tests performed by another provider:   None  Procedures  performed:   None  Pertinent History, Exam, Impression, and Recommendations:   Hypogonadism in male Previously diagnosed condition years prior with concerns over low energy levels where initial study showed low testosterone.  He is currently managed with IM exogenous testosterone injections.  Recent serum studies showed normal total testosterone level however excess free testosterone.  He does have dyslipidemia with low HDL despite oral supplementation, and he does have a history of Left shoulder musculotendinous junction derangement.  I have advised the patient that a referral to endocrinology for further evaluation management to ensure that testosterone levels both total and free are always in normal range would be appropriate.  This is considering his risk profile and past medical history.  Patient's questions were answered and he is amenable to this plan moving forward.  He has enough current refills.  We will plan a follow-up after he sees endocrinology to touch base.   Orders & Medications No orders of the defined types were placed in this encounter.  Orders Placed This Encounter  Procedures   Tdap vaccine greater than or equal to 7yo IM   TSH   T4, free   T3, free   VITAMIN D 25 Hydroxy (Vit-D Deficiency, Fractures)   Ambulatory referral to Endocrinology     Return if symptoms worsen or fail to improve.     , MD   [facility]  [place]

## 2020-08-18 NOTE — Assessment & Plan Note (Signed)
Previously diagnosed condition years prior with concerns over low energy levels where initial study showed low testosterone.  He is currently managed with IM exogenous testosterone injections.  Recent serum studies showed normal total testosterone level however excess free testosterone.  He does have dyslipidemia with low HDL despite oral supplementation, and he does have a history of Left shoulder musculotendinous junction derangement.  I have advised the patient that a referral to endocrinology for further evaluation management to ensure that testosterone levels both total and free are always in normal range would be appropriate.  This is considering his risk profile and past medical history.  Patient's questions were answered and he is amenable to this plan moving forward.  He has enough current refills.  We will plan a follow-up after he sees endocrinology to touch base.

## 2020-08-18 NOTE — Patient Instructions (Signed)
-   Obtain labs with order provided - Follow-up with endocrinology once scheduled - Contact us for questions / refills - Contact us to schedule physical (after you have seen endocrinology)

## 2020-08-19 LAB — T3, FREE: T3, Free: 3.2 pg/mL (ref 2.0–4.4)

## 2020-08-19 LAB — T4, FREE: Free T4: 1.24 ng/dL (ref 0.82–1.77)

## 2020-08-19 LAB — VITAMIN D 25 HYDROXY (VIT D DEFICIENCY, FRACTURES): Vit D, 25-Hydroxy: 59.4 ng/mL (ref 30.0–100.0)

## 2020-08-19 LAB — TSH: TSH: 0.665 u[IU]/mL (ref 0.450–4.500)

## 2020-09-15 ENCOUNTER — Encounter: Payer: Self-pay | Admitting: Endocrinology

## 2021-02-11 ENCOUNTER — Ambulatory Visit
Admission: EM | Admit: 2021-02-11 | Discharge: 2021-02-11 | Disposition: A | Discharge status: Home / Self Care | Payer: No Typology Code available for payment source | Attending: Internal Medicine | Admitting: Internal Medicine

## 2021-02-11 ENCOUNTER — Other Ambulatory Visit: Payer: Self-pay

## 2021-02-11 DIAGNOSIS — U071 COVID-19: Secondary | ICD-10-CM

## 2021-02-11 LAB — RESP PANEL BY RT-PCR (FLU A&B, COVID) ARPGX2
Influenza A by PCR: NEGATIVE
Influenza B by PCR: NEGATIVE
SARS Coronavirus 2 by RT PCR: POSITIVE — AB

## 2021-02-11 NOTE — Discharge Instructions (Addendum)
Your Covid-19 test is positive.   Please follow the current CDC guidance (updated Dec. 27, 2021) below:  Stay at home for 5 days starting on 02/09/21 If you have no symptoms or your symptoms are improving after 5 days AND you have no fever for 24 hours, you can leave your house Please continue to wear a well-fitting face mask for 5 additional days when you are around other people. You may be able to return to work after day 5 if your symptoms are improving AND you have no fever.

## 2021-02-11 NOTE — ED Provider Notes (Signed)
[facility]    CSN: [id number] Arrival date & time: 02/11/21  1327      History   Chief Complaint Chief Complaint  Patient presents with   Generalized Body Aches    HPI Johnathan King is a 35 y.o. male comes to the urgent care with complaints of a 4-day history of generalized body aches, cough and runny nose.  Over the past couple days patient had more significant muscle pain and has a visit to the urgent care.  No nausea, vomiting or diarrhea.  No fever or chills.  No sore throat. HPI  Past Medical History:  Diagnosis Date   Anxiety    Hyperlipidemia    Hypogonadism in male    Shoulder pain     Patient Active Problem List   Diagnosis Date Noted   Hypogonadism in male 08/18/2020   Strain of rotator cuff capsule 01/27/2018    Past Surgical History:  Procedure Laterality Date   WISDOM TOOTH EXTRACTION Bilateral 2009       Home Medications    Prior to Admission medications   Medication Sig Start Date End Date Taking? Authorizing Provider  escitalopram (LEXAPRO) 20 MG tablet Take 20 mg by mouth daily. 01/28/20  Yes [provider]  Multiple Vitamin (MULTIVITAMIN) tablet Take 1 tablet by mouth daily.   Yes [provider]  omega-3 acid ethyl esters (LOVAZA) 1 g capsule Take 1 g by mouth daily.   Yes [provider]  testosterone cypionate (DEPOTESTOSTERONE CYPIONATE) 200 MG/ML injection Inject 1 mL (200 mg total) into the muscle every 14 (fourteen) days. 04/24/20  Yes Tommie Sams, DO    Family History Family History  Problem Relation Age of Onset   Healthy Mother    Hypertension Father    Healthy Sister    Healthy Daughter    Healthy Son    Alzheimer's disease Maternal Grandfather    Hypertension Paternal Grandmother    Heart attack Paternal Grandfather     Social History Social History   Tobacco Use   Smoking status: Former    Types: Cigarettes   Smokeless tobacco: Current    Types: Snuff  Vaping Use    Vaping Use: Never used  Substance Use Topics   Alcohol use: Not Currently   Drug use: Never     Allergies   Patient has no known allergies.   Review of Systems Review of Systems  Constitutional: Negative.   HENT:  Positive for rhinorrhea.   Respiratory:  Positive for cough.   Genitourinary: Negative.   Musculoskeletal:  Positive for arthralgias and myalgias.    Physical Exam Triage Vital Signs ED Triage Vitals  Enc Vitals Group     BP 02/11/21 1339 (!) 158/107     Pulse Rate 02/11/21 1339 (!) 119     Resp --      Temp 02/11/21 1339 98.7 F (37.1 C)     Temp Source 02/11/21 1339 Oral     SpO2 02/11/21 1339 100 %     Weight 02/11/21 1337 210 lb (95.3 kg)     Height 02/11/21 1337 5\' 9"  (1.753 m)     Head Circumference --      Peak Flow --      Pain Score 02/11/21 1336 0     Pain Loc --      Pain Edu? --      Excl. in GC? --    No data found.  Updated Vital Signs BP (!) 158/107 (  BP Location: Right Arm)    Pulse (!) 119    Temp 98.7 F (37.1 C) (Oral)    Ht 5\' 9"  (1.753 m)    Wt 95.3 kg    SpO2 100%    BMI 31.01 kg/m   Visual Acuity Right Eye Distance:   Left Eye Distance:   Bilateral Distance:    Right Eye Near:   Left Eye Near:    Bilateral Near:     Physical Exam Vitals and nursing note reviewed.  Constitutional:      General: He is not in acute distress.    Appearance: He is not ill-appearing.  HENT:     Right Ear: Tympanic membrane normal.     Left Ear: Tympanic membrane normal.     Mouth/Throat:     Mouth: Mucous membranes are moist.     Pharynx: No posterior oropharyngeal erythema.  Cardiovascular:     Rate and Rhythm: Normal rate and regular rhythm.  Pulmonary:     Effort: Pulmonary effort is normal.     Breath sounds: Normal breath sounds.  Abdominal:     General: Bowel sounds are normal.     Palpations: Abdomen is soft.  Neurological:     Mental Status: He is alert.     UC Treatments / Results  Labs (all labs ordered are  listed, but only abnormal results are displayed) Labs Reviewed  RESP PANEL BY RT-PCR (FLU A&B, COVID) ARPGX2 - Abnormal; Notable for the following components:      Result Value   SARS Coronavirus 2 by RT PCR POSITIVE (*)    All other components within normal limits    EKG   Radiology No results found.  Procedures Procedures (including critical care time)  Medications Ordered in UC Medications - No data to display  Initial Impression / Assessment and Plan / UC Course  I have reviewed the triage vital signs and the nursing notes.  Pertinent labs & imaging results that were available during my care of the patient were reviewed by me and considered in my medical decision making (see chart for details).     1.  COVID-19 infection: Respiratory PCR is positive for COVID-19 and negative for flu A/B Increase oral fluid intake No indication for antiviral agents Tylenol/Motrin as needed for pain and/or fever Return to urgent care if symptoms worsen. Final Clinical Impressions(s) / UC Diagnoses   Final diagnoses:  [id number] virus infection     Discharge Instructions      Your Covid-19 test is positive.   Please follow the current CDC guidance (updated Dec. 27, 2021) below:  Stay at home for 5 days starting on 02/09/21 If you have no symptoms or your symptoms are improving after 5 days AND you have no fever for 24 hours, you can leave your house Please continue to wear a well-fitting face mask for 5 additional days when you are around other people. You may be able to return to work after day 5 if your symptoms are improving AND you have no fever.       ED Prescriptions   None    PDMP not reviewed this encounter.   Chase Picket, MD 02/11/21 [phone]

## 2021-02-11 NOTE — ED Triage Notes (Signed)
Pt here with C/O body aches for 2 days, has had nasal congestion for about 1 week

## 2022-08-23 ENCOUNTER — Ambulatory Visit
Admission: EM | Admit: 2022-08-23 | Discharge: 2022-08-23 | Disposition: A | Discharge status: Home / Self Care | Payer: BC Managed Care – PPO

## 2022-08-23 DIAGNOSIS — K0889 Other specified disorders of teeth and supporting structures: Secondary | ICD-10-CM

## 2022-08-23 MED ORDER — OXYCODONE HCL 5 MG PO TABS: 5.0000 mg | ORAL_TABLET | Freq: Four times a day (QID) | ORAL | 0 refills | Status: AC | PRN

## 2022-08-23 NOTE — ED Provider Notes (Signed)
[facility]    CSN: [id number] Arrival date & time: 08/23/22  1509      History   Chief Complaint No chief complaint on file.   HPI Johnathan King is a 37 y.o. male.   Patient presents for evaluation of pain to the right lower frontal tooth beginning 2 days ago, progressively worsening.  Was evaluated by dentist this morning, is in need of a root canal and was started on Augmentin.  Has been taking Tylenol 1000 mg and ibuprofen 800 mg consistently for the last 2 days with no relief of pain, interfering with sleep.  Pain is described as a constant throbbing and pulsating sensation.  Denies fever or drainage.  Was  able to move up dental appointment and has root canal scheduled for tomorrow at 1245 pm.   Past Medical History:  Diagnosis Date   Anxiety    Hyperlipidemia    Hypogonadism in male    Shoulder pain     Patient Active Problem List   Diagnosis Date Noted   Hypogonadism in male 08/18/2020   Strain of rotator cuff capsule 01/27/2018    Past Surgical History:  Procedure Laterality Date   WISDOM TOOTH EXTRACTION Bilateral 2009       Home Medications    Prior to Admission medications   Medication Sig Start Date End Date Taking? Authorizing Provider  escitalopram (LEXAPRO) 20 MG tablet Take 20 mg by mouth daily. 01/28/20  Yes [provider]  losartan (COZAAR) 50 MG tablet Take 1 tablet by mouth daily. 08/13/22  Yes [provider]  Multiple Vitamin (MULTIVITAMIN) tablet Take 1 tablet by mouth daily.   Yes [provider]  testosterone cypionate (DEPOTESTOSTERONE CYPIONATE) 200 MG/ML injection Inject 1 mL (200 mg total) into the muscle every 14 (fourteen) days. 04/24/20  Yes Cook, Jayce G, DO  omega-3 acid ethyl esters (LOVAZA) 1 g capsule Take 1 g by mouth daily.    [provider]    Family History Family History  Problem Relation Age of Onset   Healthy Mother    Hypertension Father    Healthy Sister     Healthy Daughter    Healthy Son    Alzheimer's disease Maternal Grandfather    Hypertension Paternal Grandmother    Heart attack Paternal Grandfather     Social History Social History   Tobacco Use   Smoking status: Former    Types: Cigarettes   Smokeless tobacco: Current    Types: Snuff  Vaping Use   Vaping Use: Never used  Substance Use Topics   Alcohol use: Not Currently   Drug use: Never     Allergies   Patient has no known allergies.   Review of Systems Review of Systems   Physical Exam Triage Vital Signs ED Triage Vitals  Enc Vitals Group     BP 08/23/22 1612 (!) 167/112     Pulse Rate 08/23/22 1612 63     Resp --      Temp 08/23/22 1612 97.7 F (36.5 C)     Temp Source 08/23/22 1612 Oral     SpO2 08/23/22 1612 100 %     Weight --      Height --      Head Circumference --      Peak Flow --      Pain Score 08/23/22 1611 10     Pain Loc --      Pain Edu? --      Excl.  in GC? --    No data found.  Updated Vital Signs BP (!) 167/112 (BP Location: Left Arm)   Pulse 63   Temp 97.7 F (36.5 C) (Oral)   SpO2 100%   Visual Acuity Right Eye Distance:   Left Eye Distance:   Bilateral Distance:    Right Eye Near:   Left Eye Near:    Bilateral Near:     Physical Exam Constitutional:      Appearance: Normal appearance.  HENT:     Mouth/Throat:     Comments: Moderate swelling along the right lower gumline, minimal dental decay noted, no abscess noted Eyes:     Extraocular Movements: Extraocular movements intact.  Pulmonary:     Effort: Pulmonary effort is normal.  Skin:    General: Skin is warm and dry.  Neurological:     Mental Status: He is alert and oriented to person, place, and time. Mental status is at baseline.      UC Treatments / Results  Labs (all labs ordered are listed, but only abnormal results are displayed) Labs Reviewed - No data to display  EKG   Radiology No results found.  Procedures Procedures (including  critical care time)  Medications Ordered in UC Medications - No data to display  Initial Impression / Assessment and Plan / UC Course  I have reviewed the triage vital signs and the nursing notes.  Pertinent labs & imaging results that were available during my care of the patient were reviewed by me and considered in my medical decision making (see chart for details).  Dental pain  Currently taking antibiotic, advised to continue use, prescribed oxycodone for severe pain, PDMP reviewed, low risk, recommended continued use of over-the-counter analgesics in addition and strongly advised keeping upcoming appointment tomorrow for further management Final Clinical Impressions(s) / UC Diagnoses   Final diagnoses:  None   Discharge Instructions   None    ED Prescriptions   None    PDMP not reviewed this encounter.   Valinda Hoar, NP 08/23/22 1712

## 2022-08-23 NOTE — Discharge Instructions (Signed)
Today you are being treated for your dental pain  You may take oxycodone every 6 hours as needed, be mindful this medication will make you feel drowsy, may continue Tylenol and ibuprofen in addition to this  Please keep upcoming dentist appointment for further management

## 2022-08-23 NOTE — ED Triage Notes (Signed)
Pt presents to UC c/o dental pain onset x2 days ago, pt states he was seen at the dentist and told he has an abscess.
